# Patient Record
Sex: Female | Born: 1975 | Race: Black or African American | Hispanic: No | Smoking: Never smoker
Health system: Southern US, Community
[De-identification: ages and names within clinical notes are randomized; demographics above are authoritative.]

## PROBLEM LIST (undated history)

## (undated) DIAGNOSIS — D573 Sickle-cell trait: Secondary | ICD-10-CM

## (undated) HISTORY — PX: WISDOM TOOTH EXTRACTION: SHX21

## (undated) HISTORY — DX: Sickle-cell trait: D57.3

---

## 1998-10-03 HISTORY — PX: CHOLECYSTECTOMY: SHX55

## 1999-10-04 HISTORY — PX: DILATION AND CURETTAGE, DIAGNOSTIC / THERAPEUTIC: SUR384

## 2006-03-01 ENCOUNTER — Inpatient Hospital Stay (HOSPITAL_COMMUNITY): Admission: AD | Admit: 2006-03-01 | Discharge: 2006-03-01 | Payer: Self-pay | Admitting: Gynecology

## 2013-05-28 ENCOUNTER — Ambulatory Visit (INDEPENDENT_AMBULATORY_CARE_PROVIDER_SITE_OTHER): Payer: Managed Care, Other (non HMO) | Admitting: Family Medicine

## 2013-05-28 VITALS — BP 114/70 | HR 92 | Temp 98.3°F | Resp 18 | Ht 66.0 in | Wt 191.0 lb

## 2013-05-28 DIAGNOSIS — N898 Other specified noninflammatory disorders of vagina: Secondary | ICD-10-CM

## 2013-05-28 DIAGNOSIS — Z113 Encounter for screening for infections with a predominantly sexual mode of transmission: Secondary | ICD-10-CM

## 2013-05-28 DIAGNOSIS — B379 Candidiasis, unspecified: Secondary | ICD-10-CM

## 2013-05-28 LAB — POCT WET PREP WITH KOH
KOH Prep POC: NEGATIVE
RBC Wet Prep HPF POC: NEGATIVE
Trichomonas, UA: NEGATIVE
Yeast Wet Prep HPF POC: POSITIVE

## 2013-05-28 LAB — POCT UA - MICROSCOPIC ONLY
Bacteria, U Microscopic: NEGATIVE
Casts, Ur, LPF, POC: NEGATIVE
Crystals, Ur, HPF, POC: NEGATIVE
Mucus, UA: NEGATIVE
RBC, urine, microscopic: NEGATIVE
WBC, Ur, HPF, POC: NEGATIVE
Yeast, UA: NEGATIVE

## 2013-05-28 LAB — POCT URINALYSIS DIPSTICK
Bilirubin, UA: NEGATIVE
Blood, UA: NEGATIVE
Glucose, UA: NEGATIVE
Ketones, UA: NEGATIVE
Leukocytes, UA: NEGATIVE
Nitrite, UA: NEGATIVE
Protein, UA: NEGATIVE
Spec Grav, UA: 1.01
Urobilinogen, UA: 0.2
pH, UA: 6.5

## 2013-05-28 MED ORDER — FLUCONAZOLE 150 MG PO TABS: 150.0000 mg | ORAL_TABLET | Freq: Once | ORAL | Status: DC

## 2013-05-28 NOTE — Progress Notes (Signed)
[facility]:  Office Visit  Chief Complaint:  Chief Complaint  Patient presents with  . Vaginal Discharge    x1 week    HPI: Traci Scott is a 37 y.o. female who complains of  1 week history of "vagina does not feel normal". Cannot describe it to me more than that. He menses is to start in 1 week.  She has had yeast and bacterial vaginosis. Last dx was 1 year ago. No itching , + light discharge. 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 She sees Dr. At [facility] for OB/GYn care. She has not had been on Nuvaring. Denies HIV, RPR, G/C  History reviewed. No pertinent past medical history. History reviewed. No pertinent past surgical history. History   Social History  . Marital Status: Divorced    Spouse Name: N/A    Number of Children: N/A  . Years of Education: N/A   Social History Main Topics  . Smoking status: Never Smoker   . Smokeless tobacco: None  . Alcohol Use: Yes  . Drug Use: None  . Sexual Activity: None   Other Topics Concern  . None   Social History Narrative  . None   History reviewed. No pertinent family history. No Known Allergies Prior to Admission medications   Not on File     ROS: The patient denies fevers, chills, night sweats, unintentional weight loss, chest pain, palpitations, wheezing, dyspnea on exertion, nausea, vomiting, abdominal pain, dysuria, hematuria, melena, numbness, weakness, or tingling.   All other systems have been reviewed and were otherwise negative with the exception of those mentioned in the HPI and as above.    PHYSICAL EXAM: Filed Vitals:   05/28/13 1852  BP: 114/70  Pulse: 92  Temp: 98.3 F (36.8 C)  Resp: 18   Filed Vitals:   05/28/13 1852  Height: 5\' 6"  (1.676 m)  Weight: 191 lb (86.637 kg)   Body mass index is 30.84 kg/(m^2).  General: Alert, no acute distress HEENT:  Normocephalic, atraumatic, oropharynx patent. EOMI, PERRLA Cardiovascular:  Regular rate and rhythm, no rubs murmurs or  gallops.  No Carotid bruits, radial pulse intact. No pedal edema.  Respiratory: Clear to auscultation bilaterally.  No wheezes, rales, or rhonchi.  No cyanosis, no use of accessory musculature GI: No organomegaly, abdomen is soft and non-tender, positive bowel sounds.  No masses. Skin: No rashes. Neurologic: Facial musculature symmetric. Psychiatric: Patient is appropriate throughout our interaction. Lymphatic: No cervical lymphadenopathy Musculoskeletal: Gait intact. GU-no CMT, mild dc very physiological in apperance, thin, no odor, no masses/lesions   LABS: Results for orders placed in visit on 05/28/13  POCT WET PREP WITH KOH      Result Value Range   Trichomonas, UA Negative     Clue Cells Wet Prep HPF POC 2-4     Epithelial Wet Prep HPF POC 6-15     Yeast Wet Prep HPF POC POS     Bacteria Wet Prep HPF POC 2+     RBC Wet Prep HPF POC NEG     WBC Wet Prep HPF POC 4-8     KOH Prep POC Negative    POCT UA - MICROSCOPIC ONLY      Result Value Range   WBC, Ur, HPF, POC NEG     RBC, urine, microscopic NEG     Bacteria, U Microscopic NEG     Mucus, UA NEG     Epithelial cells, urine per micros 0-1  Crystals, Ur, HPF, POC NEG     Casts, Ur, LPF, POC NEG     Yeast, UA NEG    POCT URINALYSIS DIPSTICK      Result Value Range   Color, UA YELLOW     Clarity, UA CLEAR     Glucose, UA NEG     Bilirubin, UA NEG     Ketones, UA NEG     Spec Grav, UA 1.010     Blood, UA NEG     pH, UA 6.5     Protein, UA NEG     Urobilinogen, UA 0.2     Nitrite, UA NEG     Leukocytes, UA Negative       EKG/XRAY:   Primary read interpreted by Dr. Conley Rolls at [facility].   ASSESSMENT/PLAN: Encounter Diagnoses  Name Primary?  . Vaginal discharge Yes  . Screening for STD (sexually transmitted disease)   . Yeast infection    Will only rx Diflucan If sxs do not improve then consider BV rx However she does not meet all the criteria for BV and I would hate for her to be on Flagyl unnecessarily STD  labs pending Gross sideeffects, risk and benefits, and alternatives of medications d/w patient. Patient is aware that all medications have potential sideeffects and we are unable to predict every sideeffect or drug-drug interaction that may occur.  Traci Rosenkranz PHUONG, DO 05/29/2013 1:46 PM

## 2013-05-30 LAB — GC/CHLAMYDIA PROBE AMP
CT Probe RNA: NEGATIVE
GC Probe RNA: NEGATIVE

## 2013-06-06 ENCOUNTER — Telehealth: Payer: Self-pay

## 2013-06-06 DIAGNOSIS — B9689 Other specified bacterial agents as the cause of diseases classified elsewhere: Secondary | ICD-10-CM

## 2013-06-06 MED ORDER — METRONIDAZOLE 500 MG PO TABS: 500.0000 mg | ORAL_TABLET | Freq: Two times a day (BID) | ORAL | Status: DC

## 2013-06-06 NOTE — Telephone Encounter (Signed)
Patient states she is still not feeling well and was told if her symptoms persist that she will need to take BV medication (Flagyl). She would like medication sent to [facility] located at [facility]. She would also like a call back once medications is sent to pharmacy [phone]. Thanks

## 2013-06-06 NOTE — Telephone Encounter (Signed)
Patient advised Flagyl sent in for her and no alcohol with this.

## 2019-08-12 ENCOUNTER — Other Ambulatory Visit: Payer: Self-pay

## 2019-08-12 DIAGNOSIS — Z20822 Contact with and (suspected) exposure to covid-19: Secondary | ICD-10-CM

## 2019-08-13 LAB — NOVEL CORONAVIRUS, NAA: SARS-CoV-2, NAA: NOT DETECTED

## 2021-01-22 ENCOUNTER — Other Ambulatory Visit: Payer: Self-pay | Admitting: Obstetrics and Gynecology

## 2021-01-22 DIAGNOSIS — R928 Other abnormal and inconclusive findings on diagnostic imaging of breast: Secondary | ICD-10-CM

## 2021-02-01 ENCOUNTER — Other Ambulatory Visit: Payer: Self-pay | Admitting: Obstetrics and Gynecology

## 2021-02-01 ENCOUNTER — Ambulatory Visit
Admission: RE | Admit: 2021-02-01 | Discharge: 2021-02-01 | Disposition: A | Discharge status: Home / Self Care | Payer: Managed Care, Other (non HMO) | Source: Ambulatory Visit | Attending: Obstetrics and Gynecology | Admitting: Obstetrics and Gynecology

## 2021-02-01 ENCOUNTER — Other Ambulatory Visit: Payer: Self-pay

## 2021-02-01 DIAGNOSIS — R928 Other abnormal and inconclusive findings on diagnostic imaging of breast: Secondary | ICD-10-CM

## 2021-02-02 ENCOUNTER — Ambulatory Visit
Admission: RE | Admit: 2021-02-02 | Discharge: 2021-02-02 | Disposition: A | Discharge status: Home / Self Care | Payer: Managed Care, Other (non HMO) | Source: Ambulatory Visit | Attending: Obstetrics and Gynecology | Admitting: Obstetrics and Gynecology

## 2021-02-02 DIAGNOSIS — R928 Other abnormal and inconclusive findings on diagnostic imaging of breast: Secondary | ICD-10-CM

## 2022-08-06 IMAGING — MG MM BREAST LOCALIZATION CLIP
4 series · 4 of 12 positions shown · non-contrast
Comparison: Previous exam(s).

CLINICAL DATA: Post procedure mammogram for clip placement.

EXAM:
DIAGNOSTIC LEFT MAMMOGRAM POST ULTRASOUND BIOPSY

[L CC synth-2D]
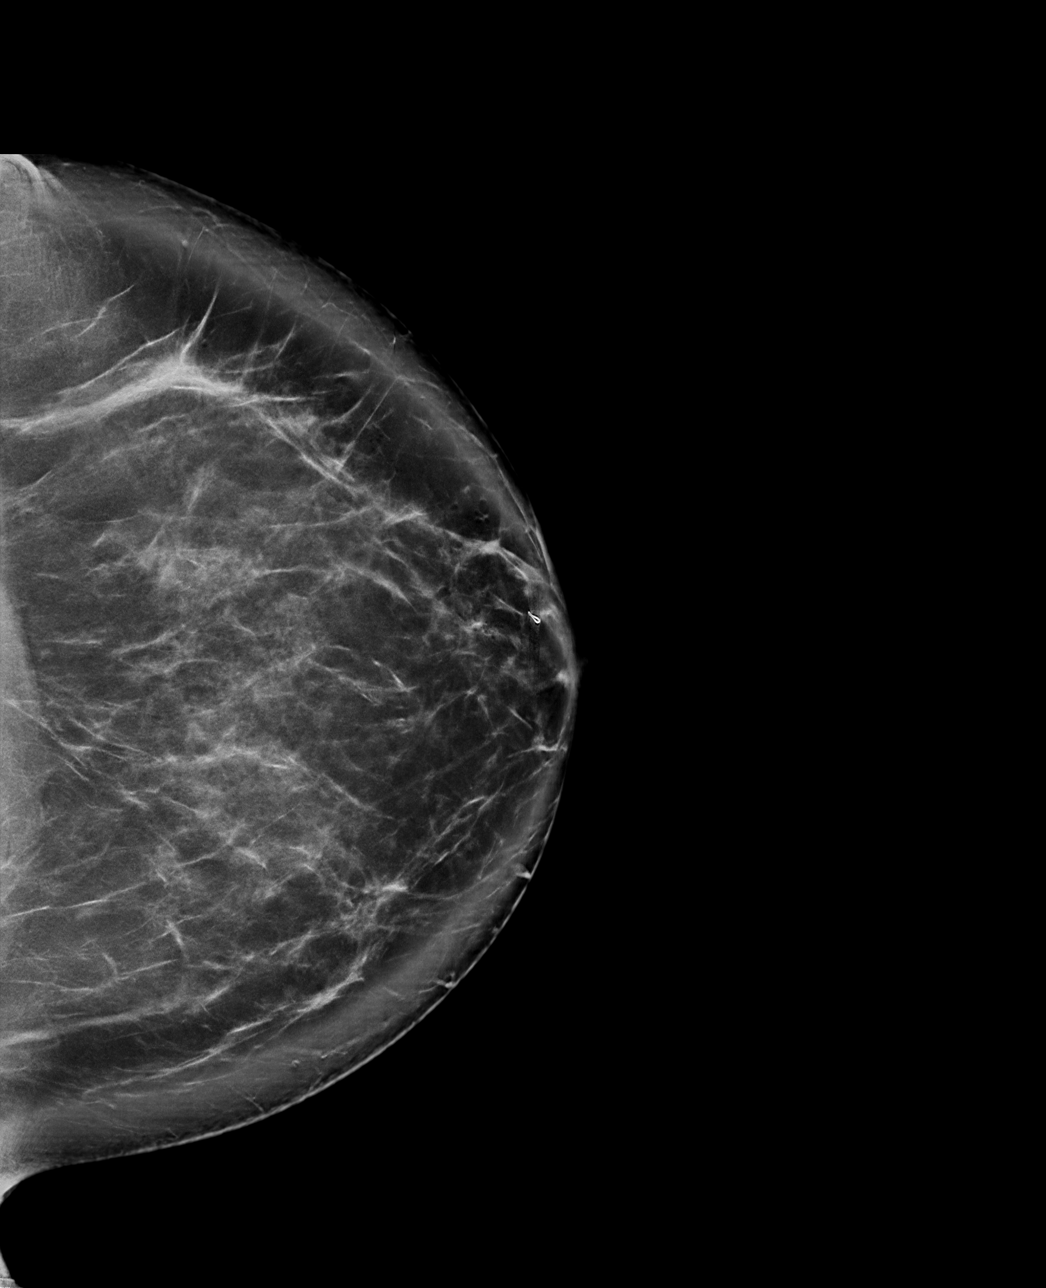

[L ML synth-2D]
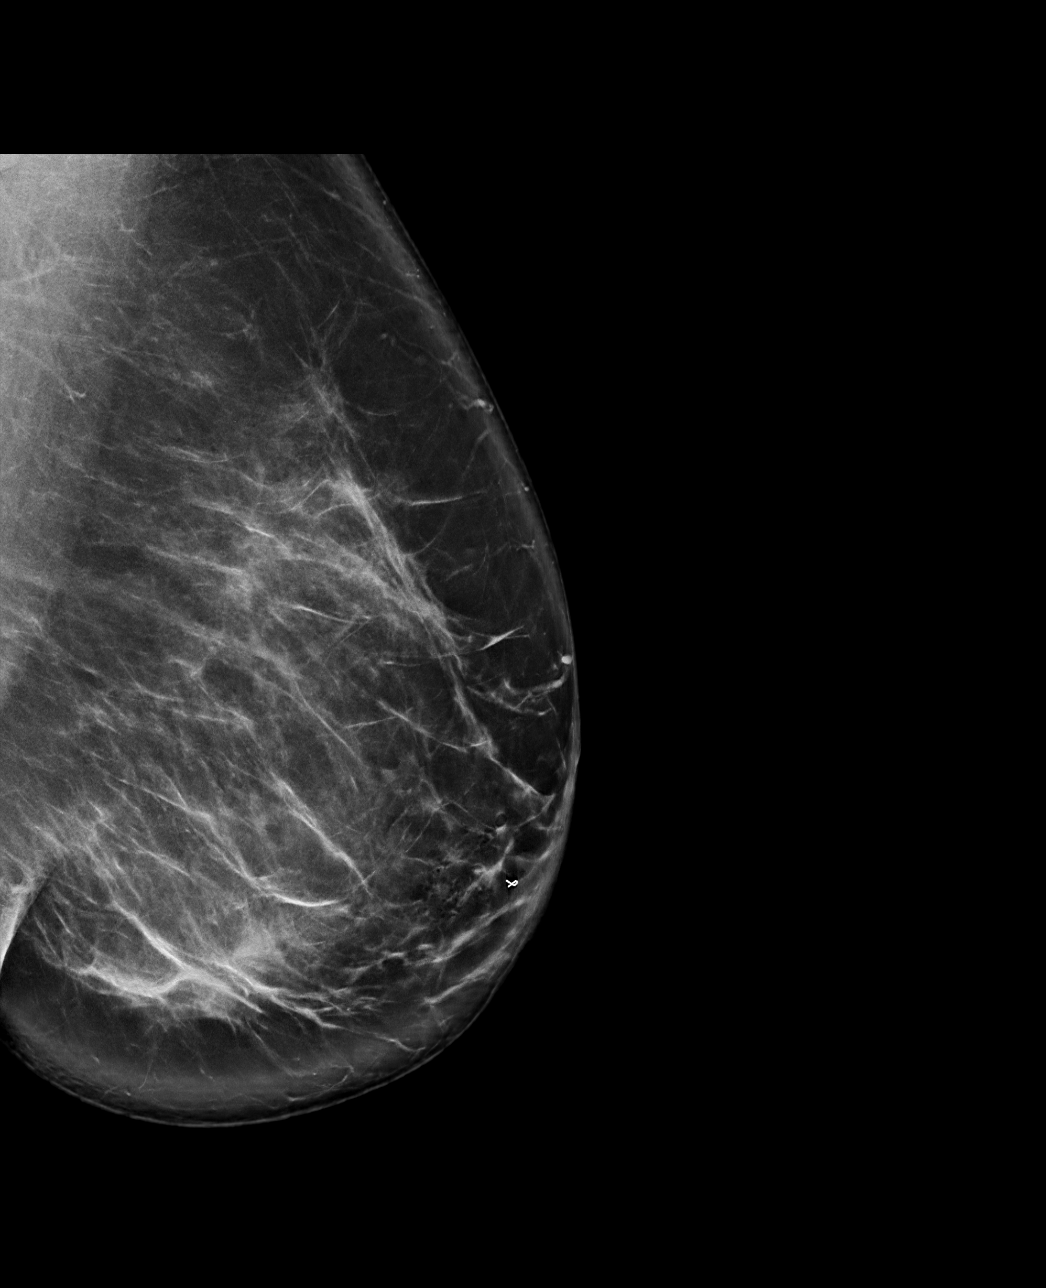

[L ML tomo · tomo slice 57/113.0]
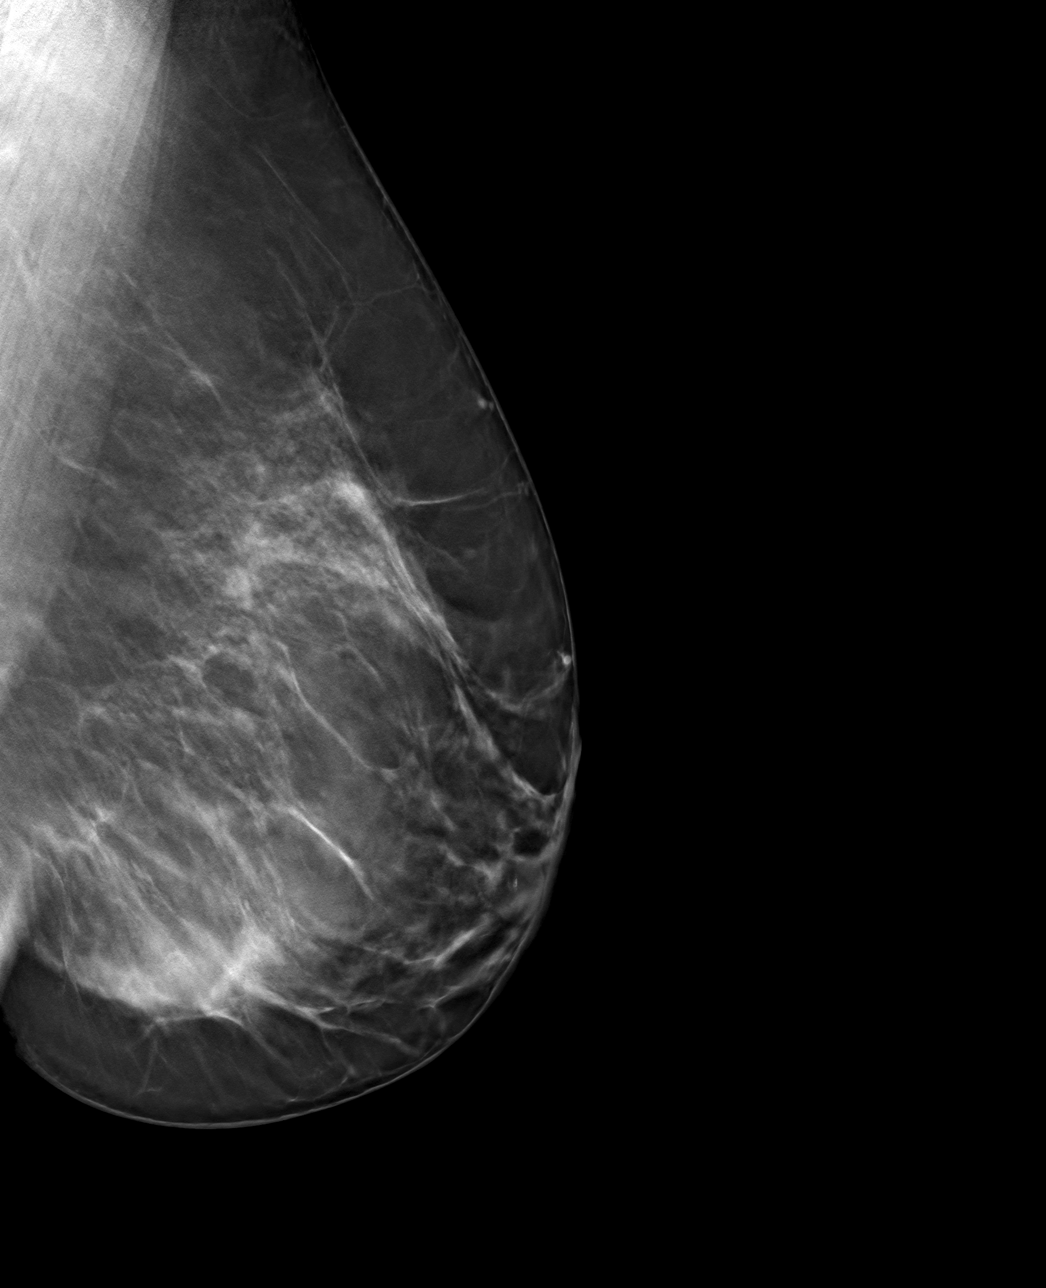

[L CC tomo · tomo slice 59/118.0]
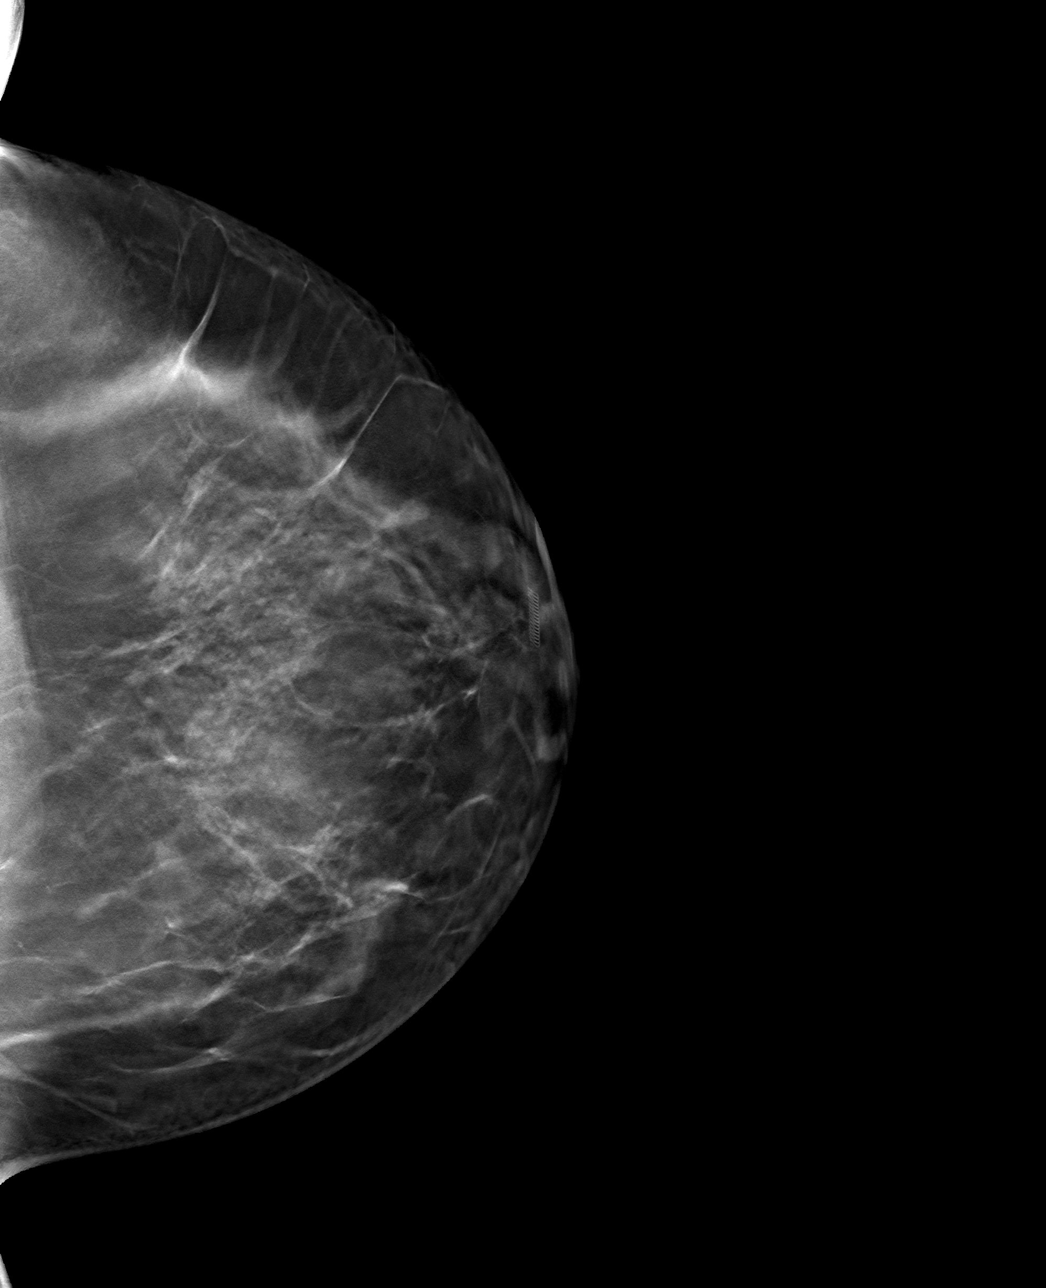

[4 of 12 positions shown; findings below may reference images not displayed]

FINDINGS: Mammographic images were obtained following ultrasound guided biopsy
of a mass in the left breast at 4 o'clock retroareolar. The biopsy
marking clip is in expected position at the site of biopsy.
IMPRESSION: Appropriate positioning of the ribbon shaped biopsy marking clip at
the site of biopsy in the left breast at 4 o'clock retroareolar.

Final Assessment: Post Procedure Mammograms for Marker Placement

## 2023-04-04 ENCOUNTER — Ambulatory Visit (AMBULATORY_SURGERY_CENTER): Payer: Managed Care, Other (non HMO)

## 2023-04-04 ENCOUNTER — Encounter: Payer: Self-pay | Admitting: Internal Medicine

## 2023-04-04 VITALS — Ht 65.0 in | Wt 210.0 lb

## 2023-04-04 DIAGNOSIS — Z1211 Encounter for screening for malignant neoplasm of colon: Secondary | ICD-10-CM

## 2023-04-04 MED ORDER — NA SULFATE-K SULFATE-MG SULF 17.5-3.13-1.6 GM/177ML PO SOLN: 1.0000 | Freq: Once | ORAL | 0 refills | Status: AC

## 2023-04-04 NOTE — Progress Notes (Signed)
Pre visit completed via phone call; Patient verified name, DOB, and address;  No egg or soy allergy known to patient;  No issues known to pt with past sedation with any surgeries or procedures; Patient denies ever being told they had issues or difficulty with intubation;  No FH of Malignant Hyperthermia; Pt is not on diet pills; Pt is not on home 02;  Pt is not on blood thinners;  Pt denies issues with constipation;  No A fib or A flutter; Have any cardiac testing pending--NO Pt instructed to use Singlecare.com or GoodRx for a price reduction on prep;   Insurance verified during PV appt=[employer]  Patient's chart reviewed by Cathlyn Parsons CNRA prior to previsit and patient appropriate for the [facility].  Previsit completed and red dot placed by patient's name on their procedure day (on provider's schedule).    Instructions sent to patient via MyChart and printed along with a [facility] Good[facility] coupon and mailed to the patient per her request;

## 2023-04-18 ENCOUNTER — Ambulatory Visit: Payer: Managed Care, Other (non HMO) | Admitting: Internal Medicine

## 2023-04-18 ENCOUNTER — Encounter: Payer: Self-pay | Admitting: Internal Medicine

## 2023-04-18 VITALS — BP 115/85 | HR 97 | Temp 98.4°F | Resp 16 | Ht 65.0 in | Wt 210.0 lb

## 2023-04-18 DIAGNOSIS — Z1211 Encounter for screening for malignant neoplasm of colon: Secondary | ICD-10-CM

## 2023-04-18 LAB — POCT URINE PREGNANCY: Preg Test, Ur: NEGATIVE

## 2023-04-18 MED ORDER — SODIUM CHLORIDE 0.9 % IV SOLN: 500.0000 mL | Freq: Once | INTRAVENOUS | Status: DC

## 2023-04-18 NOTE — Op Note (Signed)
[facility] Patient Name: Traci Scott Procedure Date: 04/18/2023 7:57 AM MRN: [id number] Endoscopist: Particia Lather , , [id number] Age: 47 Referring MD:  Date of Birth: May 06, 1976 Gender: Female Account #: [id number] Procedure:                Colonoscopy Indications:              Screening for colorectal malignant neoplasm, This                            is the patient's first colonoscopy Medicines:                Monitored Anesthesia Care Procedure:                Pre-Anesthesia Assessment:                           - Prior to the procedure, a History and Physical                            was performed, and patient medications and                            allergies were reviewed. The patient's tolerance of                            previous anesthesia was also reviewed. The risks                            and benefits of the procedure and the sedation                            options and risks were discussed with the patient.                            All questions were answered, and informed consent                            was obtained. Prior Anticoagulants: The patient has                            taken no anticoagulant or antiplatelet agents. ASA                            Grade Assessment: II - A patient with mild systemic                            disease. After reviewing the risks and benefits,                            the patient was deemed in satisfactory condition to                            undergo the procedure.  After obtaining informed consent, the colonoscope                            was passed under direct vision. Throughout the                            procedure, the patient's blood pressure, pulse, and                            oxygen saturations were monitored continuously. The                            CF [id number] #[id number] was introduced through the anus                            and advanced  to the the terminal ileum. The                            colonoscopy was performed without difficulty. The                            patient tolerated the procedure well. The quality                            of the bowel preparation was good. The terminal                            ileum, ileocecal valve, appendiceal orifice, and                            rectum were photographed. Scope In: 8:01:38 AM Scope Out: 8:20:10 AM Scope Withdrawal Time: 0 hours 14 minutes 26 seconds  Total Procedure Duration: 0 hours 18 minutes 32 seconds  Findings:                 The terminal ileum appeared normal.                           Non-bleeding internal hemorrhoids were found during                            retroflexion.                           The exam was otherwise without abnormality. Complications:            No immediate complications. Estimated Blood Loss:     Estimated blood loss: none. Impression:               - The examined portion of the ileum was normal.                           - Non-bleeding internal hemorrhoids.                           - The examination was otherwise normal.                           -  No specimens collected. Recommendation:           - Discharge patient to home (with escort).                           - Repeat colonoscopy in 10 years for screening                            purposes.                           - The findings and recommendations were discussed                            with the patient. Dr Particia Lather "[name]" Farmers,  04/18/2023 8:26:13 AM

## 2023-04-18 NOTE — Progress Notes (Signed)
GASTROENTEROLOGY PROCEDURE H&P NOTE   Primary Care Physician: Pcp, No    Reason for Procedure:   Colon cancer screening  Plan:    Colonoscopy  Patient is appropriate for endoscopic procedure(s) in the ambulatory ([facility]) setting.  The nature of the procedure, as well as the risks, benefits, and alternatives were carefully and thoroughly reviewed with the patient. Ample time for discussion and questions allowed. The patient understood, was satisfied, and agreed to proceed.     HPI: Traci Scott is a 47 y.o. female who presents for colonoscopy for colon cancer screening. Denies blood in stools, changes in bowel habits, or unintentional weight loss. Denies family history of colon cancer.  Past Medical History:  Diagnosis Date   Sickle cell trait (HCC)     Past Surgical History:  Procedure Laterality Date   CHOLECYSTECTOMY  2000   DILATION AND CURETTAGE, DIAGNOSTIC / THERAPEUTIC  2001   WISDOM TOOTH EXTRACTION      Prior to Admission medications   Medication Sig Start Date End Date Taking? Authorizing Provider  Multiple Vitamin (MULTIVITAMIN) capsule Take 1 capsule by mouth daily.   Yes [provider]  tretinoin (RETIN-A) 0.025 % cream Apply 1 Application topically at bedtime.    [provider]  WEGOVY 1 MG/0.5ML SOAJ Inject 1 mg into the skin once a week. 04/03/23   [provider]    Current Outpatient Medications  Medication Sig Dispense Refill   Multiple Vitamin (MULTIVITAMIN) capsule Take 1 capsule by mouth daily.     tretinoin (RETIN-A) 0.025 % cream Apply 1 Application topically at bedtime.     WEGOVY 1 MG/0.5ML SOAJ Inject 1 mg into the skin once a week.     Current Facility-Administered Medications  Medication Dose Route Frequency Provider Last Rate Last Admin   0.9 %  sodium chloride infusion  500 mL Intravenous Once Imogene Burn, MD        Allergies as of 04/18/2023   (No Known Allergies)    Family History  Problem  Relation Age of Onset   Colon polyps Neg Hx    Colon cancer Neg Hx    Esophageal cancer Neg Hx    Stomach cancer Neg Hx    Rectal cancer Neg Hx     Social History   Socioeconomic History   Marital status: Divorced    Spouse name: Not on file   Number of children: Not on file   Years of education: Not on file   Highest education level: Not on file  Occupational History   Not on file  Tobacco Use   Smoking status: Never   Smokeless tobacco: Not on file  Vaping Use   Vaping status: Never Used  Substance and Sexual Activity   Alcohol use: Yes    Comment: special occasions   Drug use: Never   Sexual activity: Yes  Other Topics Concern   Not on file  Social History Narrative   Not on file   Social Determinants of Health   Financial Resource Strain: Not on file  Food Insecurity: Not on file  Transportation Needs: Not on file  Physical Activity: Not on file  Stress: Not on file  Social Connections: Unknown (02/10/2022)   [facility]   Social Network    Social Network: Not on file  Intimate Partner Violence: Unknown (01/05/2022)   Received from [facility]   HITS    Physically Hurt: Not on file    Insult or Talk  Down To: Not on file    Threaten Physical Harm: Not on file    Scream or Curse: Not on file    Physical Exam: Vital signs in last 24 hours: BP 124/84   Pulse 97   Temp 98.4 F (36.9 C) (Temporal)   Ht 5\' 5"  (1.651 m)   Wt 210 lb (95.3 kg)   SpO2 98%   BMI 34.95 kg/m  GEN: NAD EYE: Sclerae anicteric ENT: MMM CV: Non-tachycardic Pulm: No increased work of breathing GI: Soft, NT/ND NEURO:  Alert & Oriented   Eulah Pont, MD [facility]  04/18/2023 7:55 AM

## 2023-04-18 NOTE — Patient Instructions (Addendum)
Repeat colonoscopy in 10 years for screening purposes.   YOU HAD AN ENDOSCOPIC PROCEDURE TODAY AT [facility]:   Refer to the procedure report that was given to you for any specific questions about what was found during the examination.  If the procedure report does not answer your questions, please call your gastroenterologist to clarify.  If you requested that your care partner not be given the details of your procedure findings, then the procedure report has been included in a sealed envelope for you to review at your convenience later.  YOU SHOULD EXPECT: Some feelings of bloating in the abdomen. Passage of more gas than usual.  Walking can help get rid of the air that was put into your GI tract during the procedure and reduce the bloating. If you had a lower endoscopy (such as a colonoscopy or flexible sigmoidoscopy) you may notice spotting of blood in your stool or on the toilet paper. If you underwent a bowel prep for your procedure, you may not have a normal bowel movement for a few days.  Please Note:  You might notice some irritation and congestion in your nose or some drainage.  This is from the oxygen used during your procedure.  There is no need for concern and it should clear up in a day or so.  SYMPTOMS TO REPORT IMMEDIATELY:  Following lower endoscopy (colonoscopy or flexible sigmoidoscopy):  Excessive amounts of blood in the stool  Significant tenderness or worsening of abdominal pains  Swelling of the abdomen that is new, acute  Fever of 100F or higher  For urgent or emergent issues, a gastroenterologist can be reached at any hour by calling [phone]. Do not use MyChart messaging for urgent concerns.    DIET:  We do recommend a small meal at first, but then you may proceed to your regular diet.  Drink plenty of fluids but you should avoid alcoholic beverages for 24 hours.  ACTIVITY:  You should plan to take it easy for the rest of today and you should  NOT DRIVE or use heavy machinery until tomorrow (because of the sedation medicines used during the test).    FOLLOW UP: Our staff will call the number listed on your records the next business day following your procedure.  We will call around 7:15- 8:00 am to check on you and address any questions or concerns that you may have regarding the information given to you following your procedure. If we do not reach you, we will leave a message.     If any biopsies were taken you will be contacted by phone or by letter within the next 1-3 weeks.  Please call us at [phone] if you have not heard about the biopsies in 3 weeks.    SIGNATURES/CONFIDENTIALITY: You and/or your care partner have signed paperwork which will be entered into your electronic medical record.  These signatures attest to the fact that that the information above on your After Visit Summary has been reviewed and is understood.  Full responsibility of the confidentiality of this discharge information lies with you and/or your care-partner.

## 2023-04-18 NOTE — Progress Notes (Signed)
 A and O x3. Report to RN. Tolerated MAC anesthesia well.

## 2023-04-18 NOTE — Progress Notes (Signed)
 VS completed by DT.  Pt's states no medical or surgical changes since previsit or office visit.  

## 2023-04-19 ENCOUNTER — Telehealth: Payer: Self-pay

## 2023-04-19 NOTE — Telephone Encounter (Signed)
  Follow up Call-     04/18/2023    7:18 AM  Call back number  Post procedure Call Back phone  # [phone]  Permission to leave phone message Yes     Patient questions:  Do you have a fever, pain , or abdominal swelling? No. Pain Score  0 *  Have you tolerated food without any problems? Yes.    Have you been able to return to your normal activities? Yes.    Do you have any questions about your discharge instructions: Diet   No. Medications  No. Follow up visit  No.  Do you have questions or concerns about your Care? No.  Actions: * If pain score is 4 or above: No action needed, pain <4.

## 2023-08-07 ENCOUNTER — Other Ambulatory Visit (HOSPITAL_COMMUNITY): Payer: Self-pay

## 2023-08-07 ENCOUNTER — Encounter (HOSPITAL_COMMUNITY): Payer: Self-pay

## 2023-08-07 MED ORDER — SAXENDA 18 MG/3ML ~~LOC~~ SOPN
PEN_INJECTOR | SUBCUTANEOUS | 0 refills | Status: AC
  Filled 2023-08-07: qty 15, 35d supply, fill #0

## 2023-08-07 MED ORDER — NOVOFINE PEN NEEDLE 32G X 6 MM MISC
1.0000 | 3 refills | Status: AC | PRN
  Filled 2023-08-07: qty 100, 100d supply, fill #0

## 2024-02-06 ENCOUNTER — Other Ambulatory Visit (HOSPITAL_COMMUNITY): Payer: Self-pay

## 2024-02-06 MED ORDER — WEGOVY 0.25 MG/0.5ML ~~LOC~~ SOAJ
0.2500 mg | SUBCUTANEOUS | 0 refills | Status: DC
  Filled 2024-02-06 (×2): qty 2, 28d supply, fill #0

## 2024-02-08 ENCOUNTER — Other Ambulatory Visit: Payer: Self-pay

## 2024-02-08 ENCOUNTER — Other Ambulatory Visit (HOSPITAL_COMMUNITY): Payer: Self-pay

## 2024-02-22 ENCOUNTER — Other Ambulatory Visit (HOSPITAL_COMMUNITY): Payer: Self-pay

## 2024-03-06 ENCOUNTER — Other Ambulatory Visit (HOSPITAL_COMMUNITY): Payer: Self-pay

## 2024-03-06 MED ORDER — WEGOVY 0.25 MG/0.5ML ~~LOC~~ SOAJ
0.2500 mg | SUBCUTANEOUS | 0 refills | Status: AC
  Filled 2024-03-06: qty 2, 28d supply, fill #0

## 2024-03-06 MED ORDER — ZEPBOUND 2.5 MG/0.5ML ~~LOC~~ SOAJ
2.5000 mg | SUBCUTANEOUS | 0 refills | Status: AC
  Filled 2024-03-06: qty 2, 28d supply, fill #0

## 2024-03-07 ENCOUNTER — Other Ambulatory Visit (HOSPITAL_COMMUNITY): Payer: Self-pay

## 2024-03-27 ENCOUNTER — Other Ambulatory Visit (HOSPITAL_COMMUNITY): Payer: Self-pay

## 2024-03-27 MED ORDER — ZEPBOUND 5 MG/0.5ML ~~LOC~~ SOAJ
5.0000 mg | SUBCUTANEOUS | 0 refills | Status: AC
  Filled 2024-03-27: qty 2, 28d supply, fill #0

## 2024-04-16 ENCOUNTER — Other Ambulatory Visit (HOSPITAL_COMMUNITY): Payer: Self-pay

## 2024-04-16 MED ORDER — ZEPBOUND 7.5 MG/0.5ML ~~LOC~~ SOAJ
7.5000 mg | SUBCUTANEOUS | 0 refills | Status: DC
  Filled 2024-04-16: qty 2, 28d supply, fill #0

## 2024-05-14 ENCOUNTER — Other Ambulatory Visit (HOSPITAL_COMMUNITY): Payer: Self-pay

## 2024-05-14 MED ORDER — ZEPBOUND 7.5 MG/0.5ML ~~LOC~~ SOAJ
7.5000 mg | SUBCUTANEOUS | 0 refills | Status: DC
  Filled 2024-05-14 – 2024-05-27 (×2): qty 2, 28d supply, fill #0

## 2024-05-23 ENCOUNTER — Other Ambulatory Visit (HOSPITAL_COMMUNITY): Payer: Self-pay

## 2024-05-27 ENCOUNTER — Other Ambulatory Visit (HOSPITAL_COMMUNITY): Payer: Self-pay

## 2024-06-06 ENCOUNTER — Other Ambulatory Visit (HOSPITAL_COMMUNITY): Payer: Self-pay

## 2024-06-06 MED ORDER — ZEPBOUND 7.5 MG/0.5ML ~~LOC~~ SOAJ
7.5000 mg | SUBCUTANEOUS | 0 refills | Status: DC
  Filled 2024-06-06 – 2024-06-17 (×4): qty 2, 28d supply, fill #0
  Filled ????-??-??: fill #0

## 2024-06-11 ENCOUNTER — Other Ambulatory Visit (HOSPITAL_COMMUNITY): Payer: Self-pay

## 2024-06-12 ENCOUNTER — Other Ambulatory Visit (HOSPITAL_COMMUNITY): Payer: Self-pay

## 2024-06-17 ENCOUNTER — Other Ambulatory Visit (HOSPITAL_COMMUNITY): Payer: Self-pay

## 2024-06-26 ENCOUNTER — Other Ambulatory Visit (HOSPITAL_COMMUNITY): Payer: Self-pay

## 2024-06-26 MED ORDER — ZEPBOUND 7.5 MG/0.5ML ~~LOC~~ SOAJ
7.5000 mg | SUBCUTANEOUS | 0 refills | Status: DC
  Filled 2024-07-11: qty 2, 28d supply, fill #0

## 2024-07-12 ENCOUNTER — Other Ambulatory Visit (HOSPITAL_COMMUNITY): Payer: Self-pay

## 2024-07-18 ENCOUNTER — Other Ambulatory Visit (HOSPITAL_COMMUNITY): Payer: Self-pay

## 2024-07-18 MED ORDER — ZEPBOUND 7.5 MG/0.5ML ~~LOC~~ SOAJ
7.5000 mg | SUBCUTANEOUS | 0 refills | Status: AC
  Filled 2024-07-18 – 2024-08-07 (×2): qty 2, 28d supply, fill #0

## 2024-08-07 ENCOUNTER — Other Ambulatory Visit (HOSPITAL_COMMUNITY): Payer: Self-pay

## 2024-08-08 ENCOUNTER — Other Ambulatory Visit (HOSPITAL_COMMUNITY): Payer: Self-pay

## 2024-08-08 MED ORDER — ZEPBOUND 10 MG/0.5ML ~~LOC~~ SOAJ
10.0000 mg | SUBCUTANEOUS | 0 refills | Status: DC
  Filled 2024-08-08: qty 2, 28d supply, fill #0

## 2024-09-02 ENCOUNTER — Other Ambulatory Visit (HOSPITAL_COMMUNITY): Payer: Self-pay

## 2024-09-02 MED ORDER — ZEPBOUND 10 MG/0.5ML ~~LOC~~ SOAJ
10.0000 mg | SUBCUTANEOUS | 0 refills | Status: DC
  Filled 2024-09-02: qty 2, 28d supply, fill #0

## 2024-09-04 ENCOUNTER — Other Ambulatory Visit: Payer: Self-pay | Admitting: Medical Genetics

## 2024-09-06 ENCOUNTER — Inpatient Hospital Stay (HOSPITAL_COMMUNITY)
Admission: RE | Admit: 2024-09-06 | Discharge: 2024-09-06 | Payer: Self-pay | Attending: Medical Genetics | Admitting: Medical Genetics

## 2024-09-20 LAB — GENECONNECT MOLECULAR SCREEN: Genetic Analysis Overall Interpretation: NEGATIVE

## 2024-09-23 ENCOUNTER — Other Ambulatory Visit (HOSPITAL_COMMUNITY): Payer: Self-pay

## 2024-09-23 MED ORDER — ZEPBOUND 10 MG/0.5ML ~~LOC~~ SOAJ
10.0000 mg | SUBCUTANEOUS | 0 refills | Status: DC
  Filled 2024-09-23: qty 2, 28d supply, fill #0

## 2024-10-16 ENCOUNTER — Other Ambulatory Visit (HOSPITAL_COMMUNITY): Payer: Self-pay

## 2024-10-16 ENCOUNTER — Other Ambulatory Visit: Payer: Self-pay

## 2024-10-16 MED ORDER — ZEPBOUND 10 MG/0.5ML ~~LOC~~ SOAJ
10.0000 mg | SUBCUTANEOUS | 0 refills | Status: AC
  Filled 2024-10-16: qty 2, 28d supply, fill #0

## 2024-10-18 ENCOUNTER — Other Ambulatory Visit (HOSPITAL_BASED_OUTPATIENT_CLINIC_OR_DEPARTMENT_OTHER): Payer: Self-pay

## 2024-10-18 ENCOUNTER — Other Ambulatory Visit (HOSPITAL_COMMUNITY): Payer: Self-pay
